# Patient Record
Sex: Female | Born: 1995 | Race: White | Hispanic: No | Marital: Single
Health system: Southern US, Community
[De-identification: ages and names within clinical notes are randomized; demographics above are authoritative.]

## PROBLEM LIST (undated history)

## (undated) DIAGNOSIS — T4145XA Adverse effect of unspecified anesthetic, initial encounter: Secondary | ICD-10-CM

## (undated) DIAGNOSIS — F909 Attention-deficit hyperactivity disorder, unspecified type: Secondary | ICD-10-CM

## (undated) DIAGNOSIS — T8859XA Other complications of anesthesia, initial encounter: Secondary | ICD-10-CM

## (undated) DIAGNOSIS — Z8614 Personal history of Methicillin resistant Staphylococcus aureus infection: Secondary | ICD-10-CM

## (undated) DIAGNOSIS — N6320 Unspecified lump in the left breast, unspecified quadrant: Secondary | ICD-10-CM

## (undated) HISTORY — PX: MYRINGOTOMY WITH TUBE PLACEMENT: SHX5663

## (undated) HISTORY — PX: TONSILLECTOMY AND ADENOIDECTOMY: SHX28

## (undated) HISTORY — PX: KNEE ARTHROSCOPY: SHX127

## (undated) HISTORY — PX: BREAST SURGERY: SHX581

---
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
## 2019-01-22 ENCOUNTER — Other Ambulatory Visit: Payer: Self-pay | Admitting: General Surgery

## 2019-01-22 DIAGNOSIS — N6310 Unspecified lump in the right breast, unspecified quadrant: Secondary | ICD-10-CM

## 2019-01-25 ENCOUNTER — Ambulatory Visit
Admission: RE | Admit: 2019-01-25 | Discharge: 2019-01-25 | Disposition: A | Discharge status: Home / Self Care | Payer: 59 | Source: Ambulatory Visit | Attending: General Surgery | Admitting: General Surgery

## 2019-01-25 ENCOUNTER — Other Ambulatory Visit: Payer: Self-pay

## 2019-01-25 ENCOUNTER — Other Ambulatory Visit: Payer: Self-pay | Admitting: General Surgery

## 2019-01-25 DIAGNOSIS — N6310 Unspecified lump in the right breast, unspecified quadrant: Secondary | ICD-10-CM

## 2019-07-31 ENCOUNTER — Other Ambulatory Visit: Payer: Self-pay | Admitting: General Surgery

## 2019-07-31 ENCOUNTER — Ambulatory Visit
Admission: RE | Admit: 2019-07-31 | Discharge: 2019-07-31 | Disposition: A | Discharge status: Home / Self Care | Payer: 59 | Source: Ambulatory Visit | Attending: General Surgery | Admitting: General Surgery

## 2019-07-31 ENCOUNTER — Other Ambulatory Visit: Payer: 59

## 2019-07-31 ENCOUNTER — Other Ambulatory Visit: Payer: Self-pay

## 2019-07-31 DIAGNOSIS — N6310 Unspecified lump in the right breast, unspecified quadrant: Secondary | ICD-10-CM

## 2020-02-03 ENCOUNTER — Other Ambulatory Visit: Payer: No Typology Code available for payment source

## 2020-03-16 ENCOUNTER — Other Ambulatory Visit: Payer: Self-pay

## 2020-03-16 ENCOUNTER — Ambulatory Visit
Admission: RE | Admit: 2020-03-16 | Discharge: 2020-03-16 | Disposition: A | Discharge status: Home / Self Care | Payer: No Typology Code available for payment source | Source: Ambulatory Visit | Attending: General Surgery | Admitting: General Surgery

## 2020-03-16 DIAGNOSIS — N6310 Unspecified lump in the right breast, unspecified quadrant: Secondary | ICD-10-CM

## 2020-08-10 IMAGING — US US BREAST*R* LIMITED INC AXILLA
1 series · 5 of 5 positions shown · non-contrast
Comparison: 01/25/2019

CLINICAL DATA: First six-month follow-up for probably benign mass
in the RIGHT breast. Patient feels a mass has increased in size but
is uncertain, given the lesion's mobility. The patient is being
followed by Dr. Tobito.

EXAM:
ULTRASOUND OF THE RIGHT BREAST

[Series 1: us breast*right* limited inc axilla · 0.05mm/px · 5 of 5 slices shown]
[im 1/5]
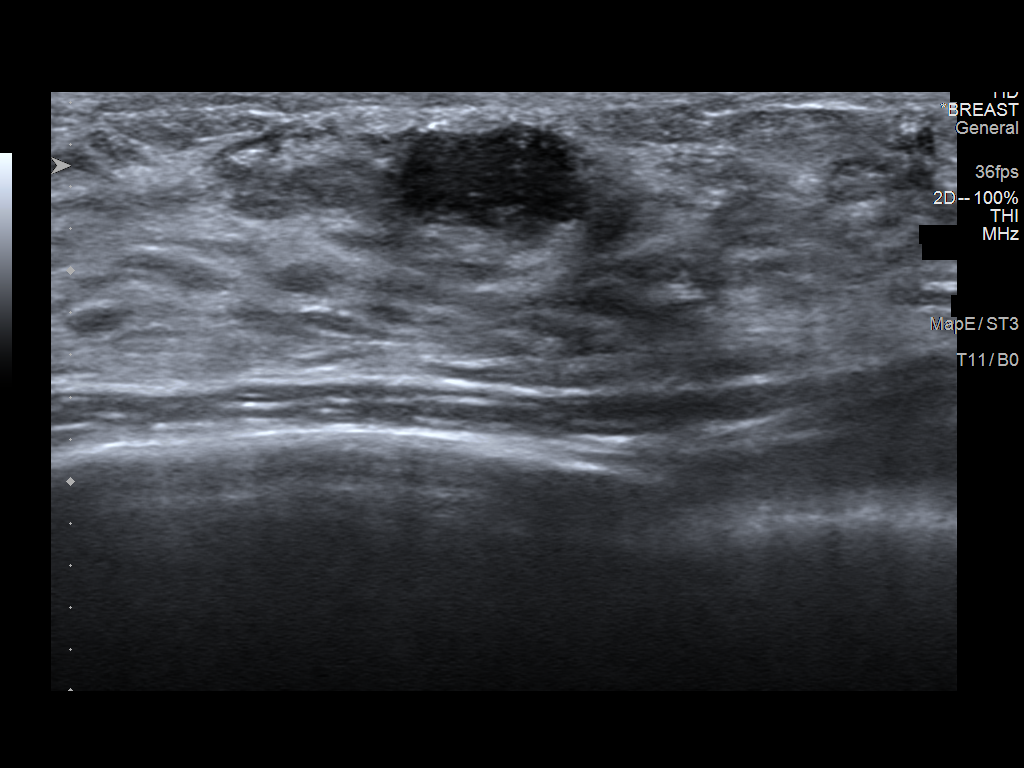
[im 2/5]
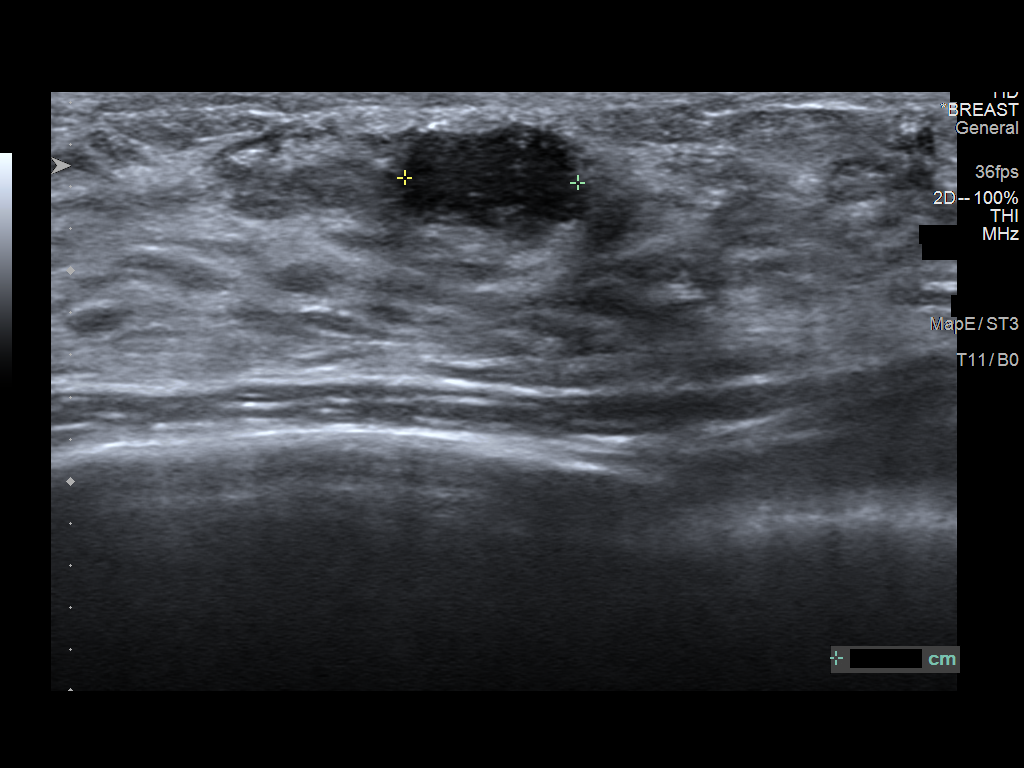
[im 3/5]
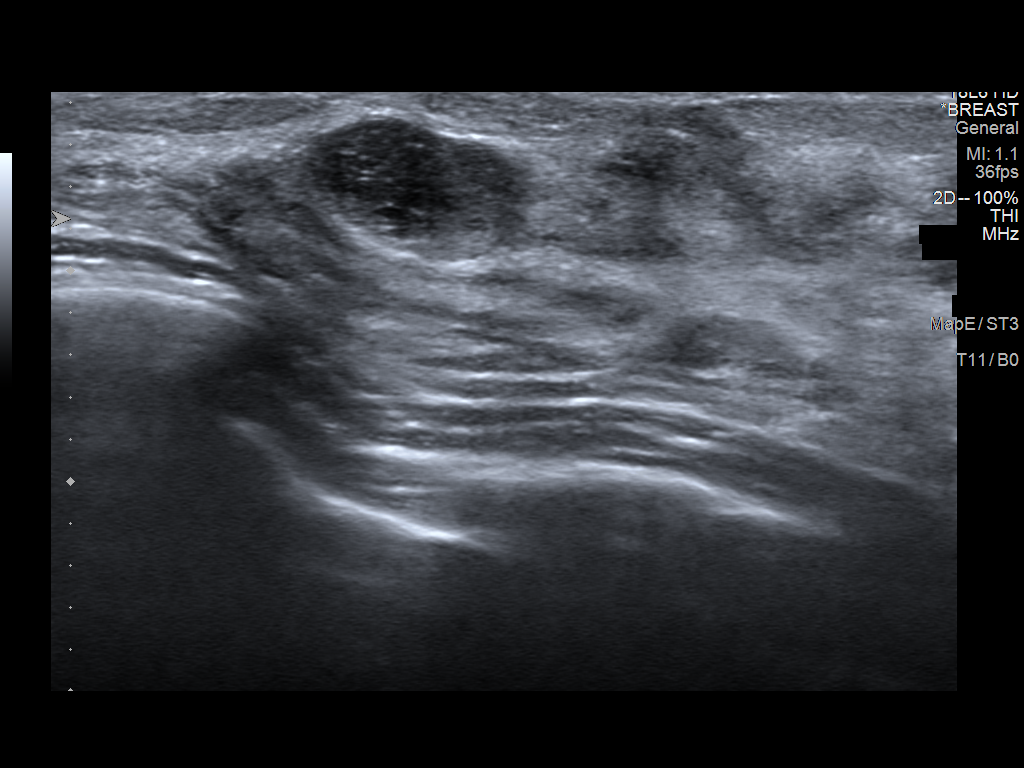
[im 4/5]
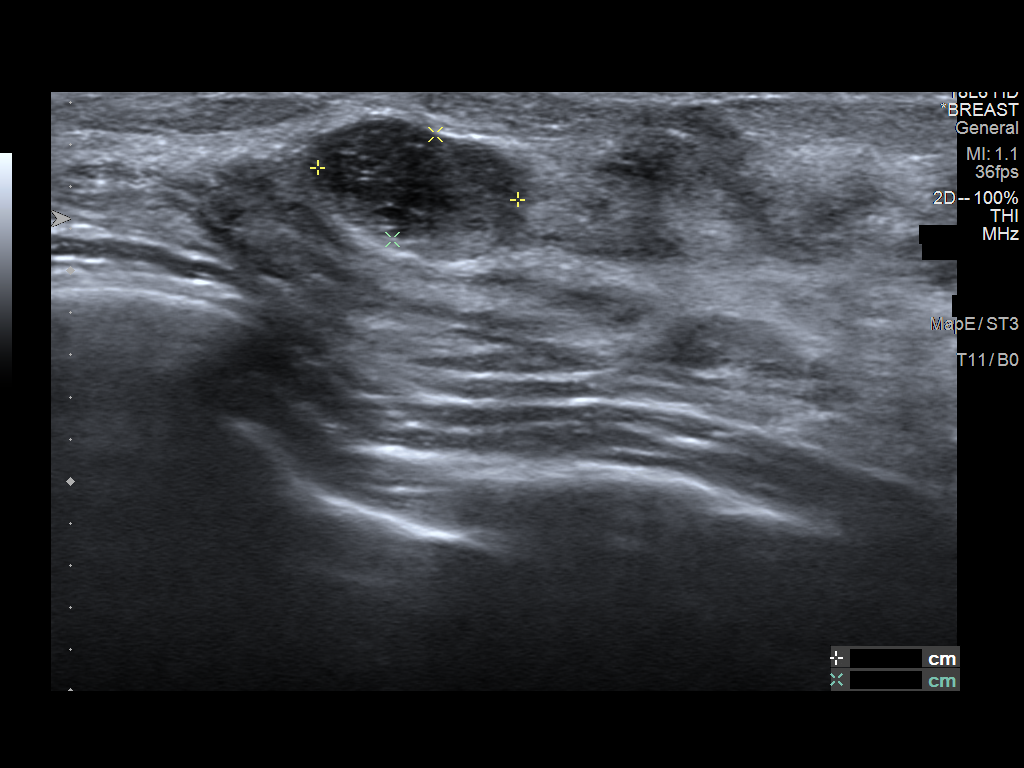
[im 5/5]
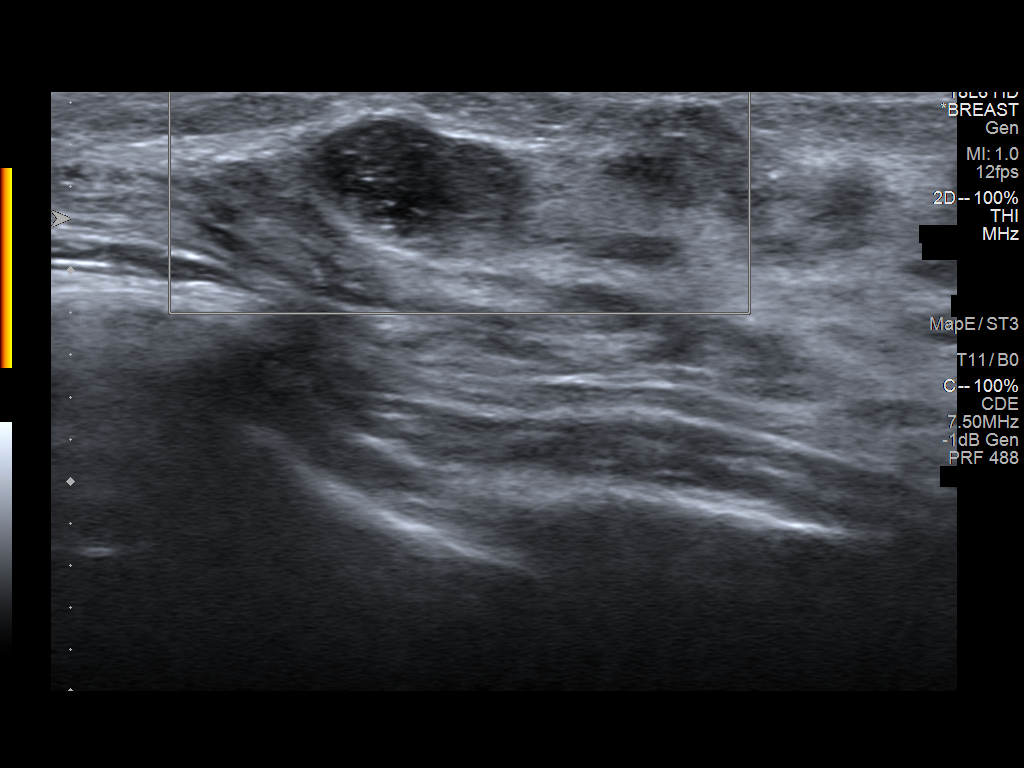

[5 of 5 positions shown; findings below may reference images not displayed]

FINDINGS: Targeted ultrasound is performed, showing a circumscribed oval
parallel hypoechoic mass in the 6:30 o'clock location of the RIGHT
breast 6 centimeters from nipple which measures 1.0 x 0.5 x
centimeters. Previously mass measured 0.9 x 0.6 x 0.8 centimeters.
IMPRESSION: Stable appearance of probably benign mass in the RIGHT breast,
likely fibroadenoma. Continued followup is recommended to document 2
years of stability.

RECOMMENDATION:
RIGHT breast ultrasound recommended in 6 months.

I have discussed the findings and recommendations with the patient.
If applicable, a reminder letter will be sent to the patient
regarding the next appointment.

BI-RADS CATEGORY  3: Probably benign.

## 2021-03-05 ENCOUNTER — Other Ambulatory Visit: Payer: Self-pay | Admitting: General Surgery

## 2021-03-05 DIAGNOSIS — N6310 Unspecified lump in the right breast, unspecified quadrant: Secondary | ICD-10-CM

## 2021-04-05 ENCOUNTER — Other Ambulatory Visit: Payer: No Typology Code available for payment source

## 2021-04-09 ENCOUNTER — Other Ambulatory Visit: Payer: Self-pay

## 2021-04-09 ENCOUNTER — Ambulatory Visit
Admission: RE | Admit: 2021-04-09 | Discharge: 2021-04-09 | Disposition: A | Discharge status: Home / Self Care | Payer: No Typology Code available for payment source | Source: Ambulatory Visit | Attending: General Surgery | Admitting: General Surgery

## 2021-04-09 DIAGNOSIS — N6310 Unspecified lump in the right breast, unspecified quadrant: Secondary | ICD-10-CM

## 2022-04-20 IMAGING — US US BREAST*R* LIMITED INC AXILLA
1 series · 6 of 6 positions shown · non-contrast
Comparison: 03/16/2020, 07/31/2019, 01/25/2019.

CLINICAL DATA: Two year interval follow-up of a likely benign mass
involving the lower RIGHT breast, likely a fibroadenoma.

Strong family history of breast cancer in her mother in her mid to
late 30s and in her maternal grandmother. The patient's mother
carries the BRCA gene mutation.
EXAM:
ULTRASOUND OF THE RIGHT BREAST

[Series 1: us breast*right* limited inc axilla · 0.04mm/px · 6 of 6 slices shown]
[im 1/6]
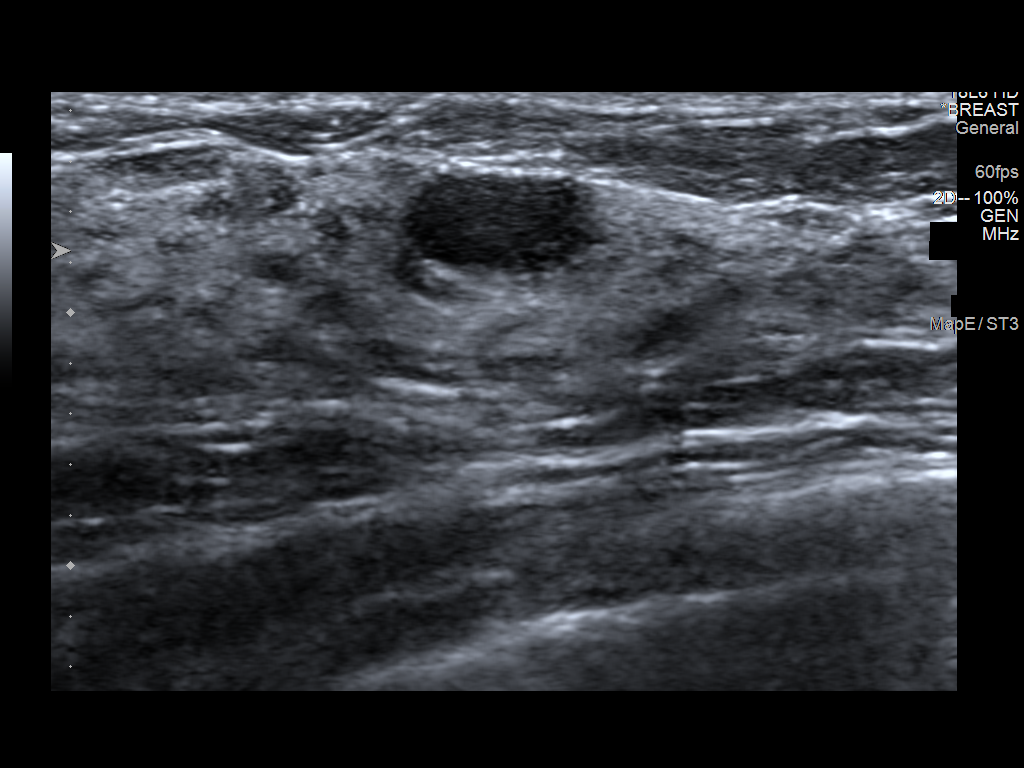
[im 2/6]
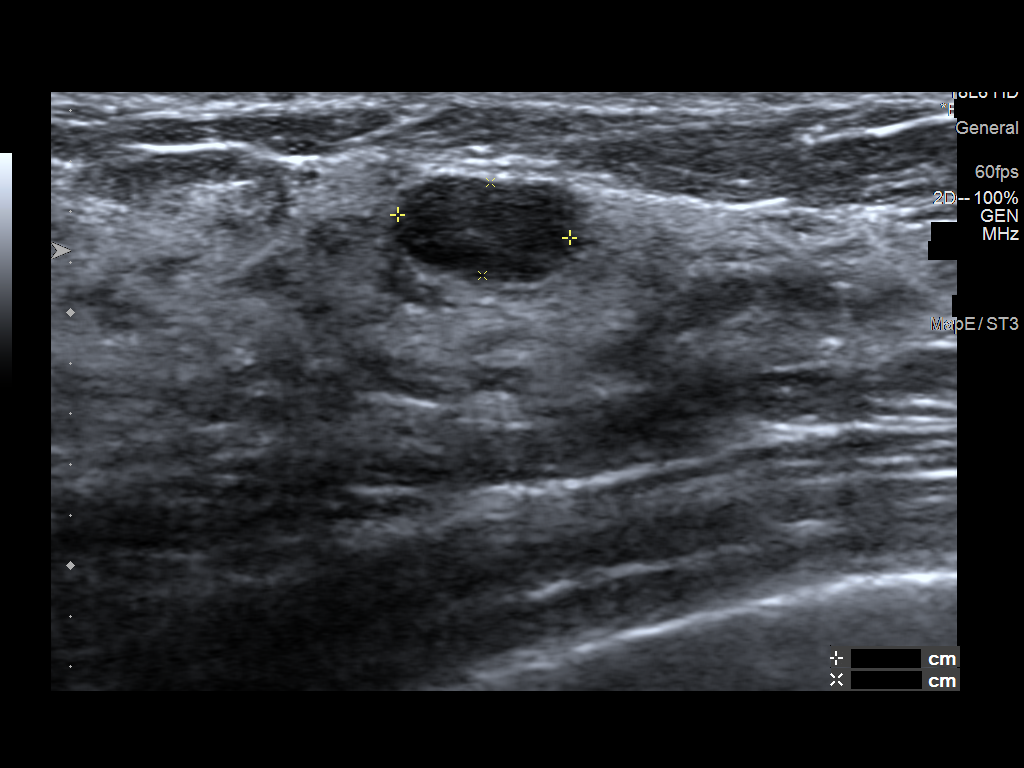
[im 3/6]
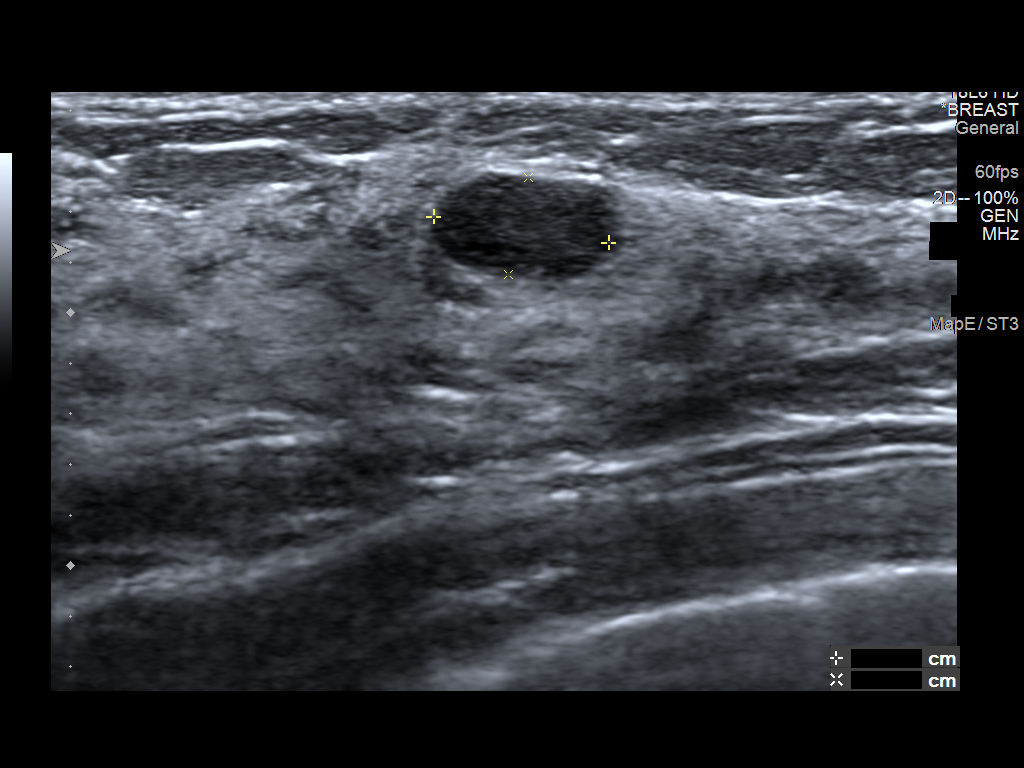
[im 4/6]
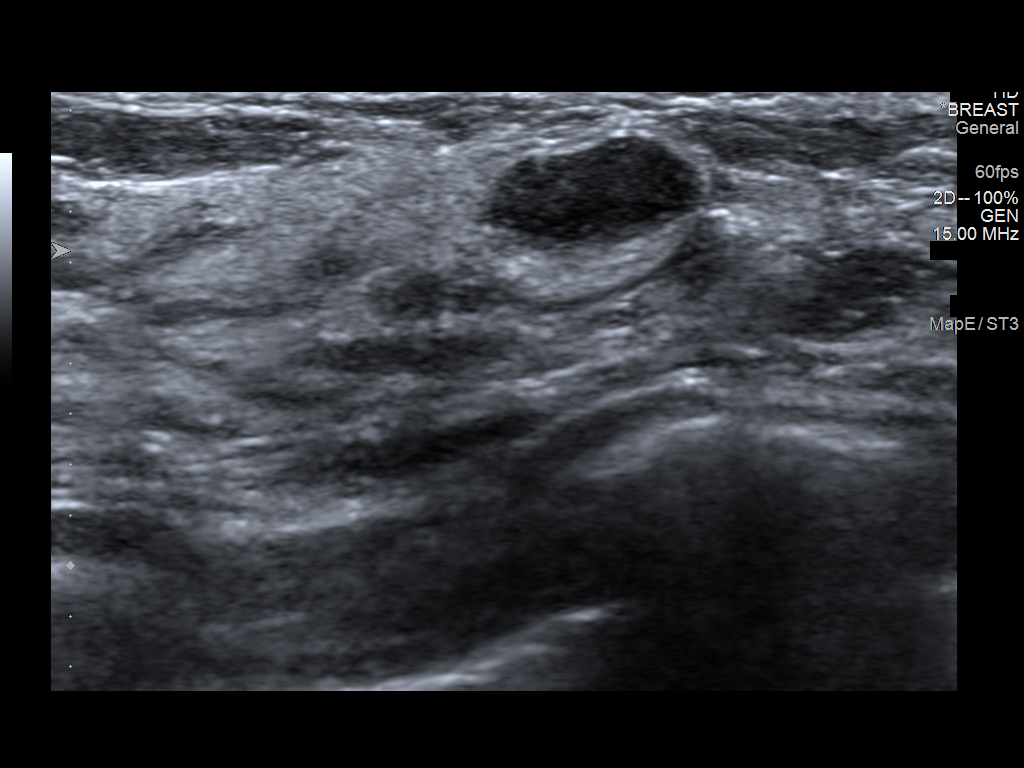
[im 5/6]
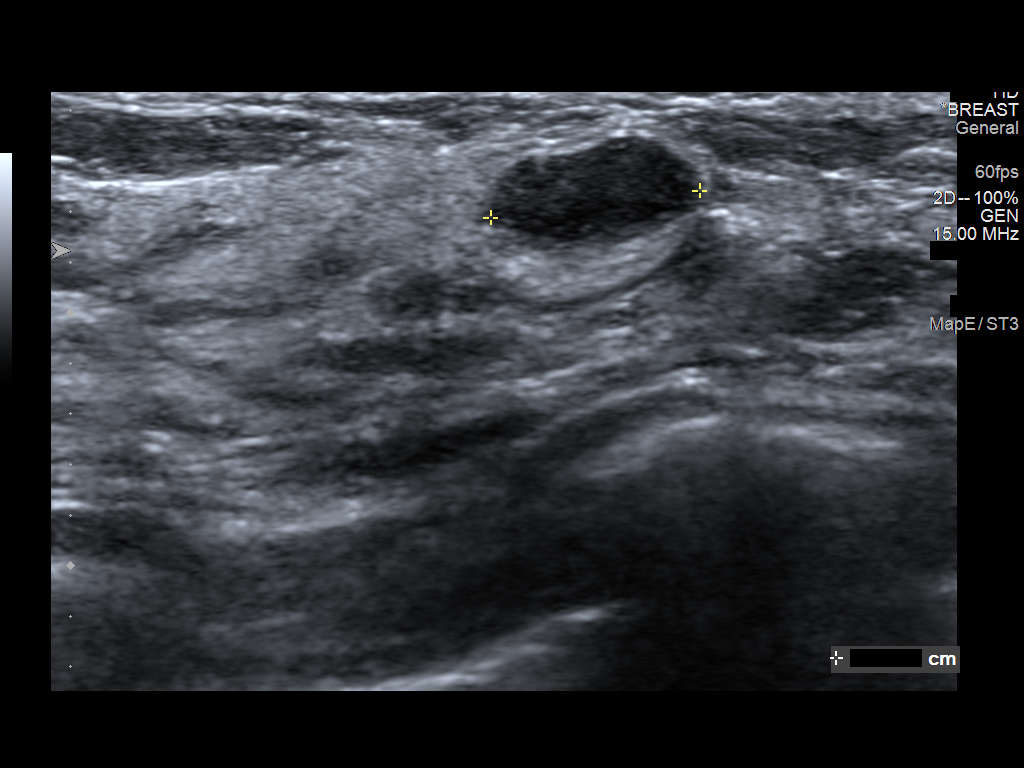
[im 6/6]
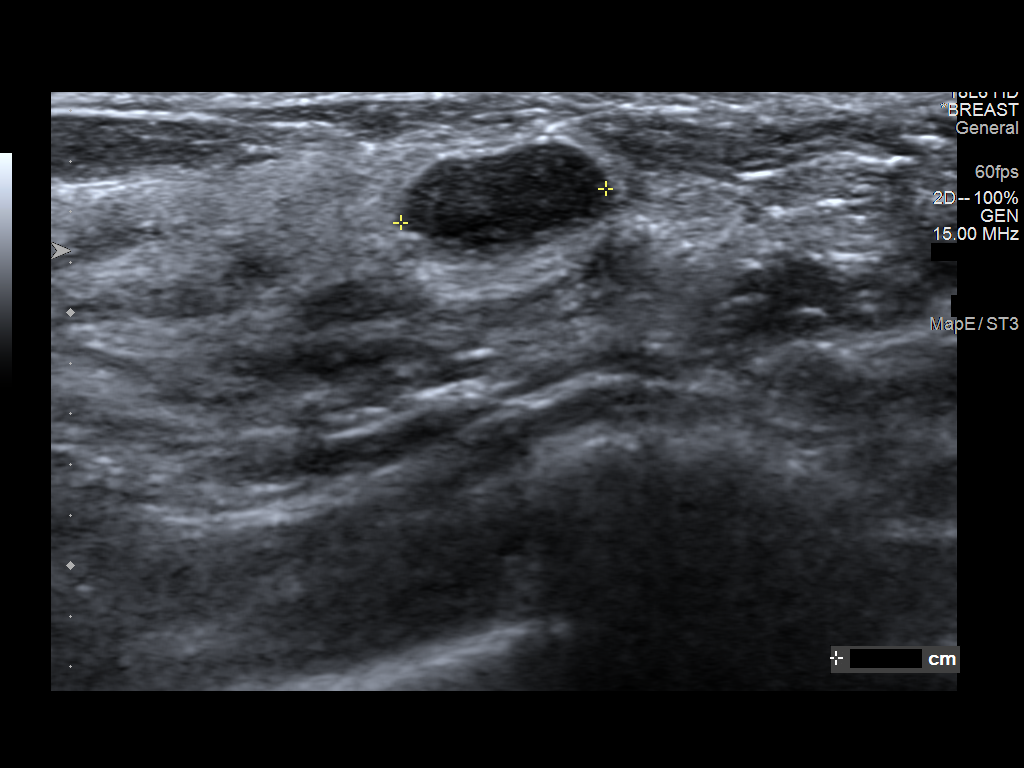

[6 of 6 positions shown; findings below may reference images not displayed]

FINDINGS: Targeted ultrasound is performed, showing the previously identified
circumscribed oval parallel hypoechoic mass at the 6:30 o'clock
position 6 cm from the nipple measuring approximately 8 x 4 x 7 mm
(previously 9 x 6 x 8 mm on 01/25/2019), demonstrating posterior
acoustic enhancement.
IMPRESSION: Stable 8 mm mass involving the lower RIGHT breast at the 6:30
o'clock position dating back to December 2018, confirming benignity,
likely a fibroadenoma.

RECOMMENDATION:
1. No further imaging follow-up is necessary for the RIGHT breast
mass.
2. Given the patient's family history in her mother, high risk
screening MRI might be considered, starting 10 years earlier than
the age of her mother's breast cancer diagnosis, no earlier than age
25.

I have discussed the findings and recommendations with the patient.

BI-RADS CATEGORY  2: Benign.
